# Patient Record
Sex: Male | Born: 1945 | Race: Black or African American | Hispanic: No | Marital: Married | Smoking: Never smoker
Health system: Southern US, Community
[De-identification: ages and names within clinical notes are randomized; demographics above are authoritative.]

## PROBLEM LIST (undated history)

## (undated) ENCOUNTER — Emergency Department (HOSPITAL_COMMUNITY): Payer: Self-pay | Source: Home / Self Care

## (undated) DIAGNOSIS — E785 Hyperlipidemia, unspecified: Secondary | ICD-10-CM

## (undated) DIAGNOSIS — I10 Essential (primary) hypertension: Secondary | ICD-10-CM

## (undated) HISTORY — PX: BACK SURGERY: SHX140

---

## 2001-01-04 ENCOUNTER — Encounter: Admission: RE | Admit: 2001-01-04 | Discharge: 2001-01-04 | Payer: Self-pay | Admitting: Internal Medicine

## 2016-03-16 ENCOUNTER — Telehealth: Payer: Self-pay | Admitting: Orthopedic Surgery

## 2016-03-16 NOTE — Telephone Encounter (Signed)
Patient inquiring about scheduling an appointment within [facility] with one of our providers for problem of left knee pain, Vs. Scheduling  [facility] due to long waiting time, states end of January, when [facility] doctor has availability.  I relayed that [place] has a specific form which would need to be accessed and completed, in order for the [facility] to cover cost of services.  A copy of Records has already been received today, 03/16/16, via fax, which will also require review by Dr Romeo AppleHarrison.  Patient would also need to obtain films, once the [facility] form process has been determined.  Patient's ph#'s [phone] or cell# [phone]

## 2016-03-17 NOTE — Telephone Encounter (Signed)
Shirlee LimerickMarion, can you advise on this request, please?

## 2016-03-19 NOTE — Telephone Encounter (Signed)
Noted.  Awaiting review of records received by Dr Romeo AppleHarrison. Patient aware.

## 2016-03-19 NOTE — Telephone Encounter (Signed)
The patient would need to get written approval for him to be seen at our office.  Dr Romeo AppleHarrison would also need to review the records prior to the patient getting the approval to see if Dr Romeo AppleHarrison would take the case.

## 2016-06-09 NOTE — Telephone Encounter (Signed)
No further records or communication.

## 2018-08-08 ENCOUNTER — Emergency Department (HOSPITAL_COMMUNITY): Payer: No Typology Code available for payment source

## 2018-08-08 ENCOUNTER — Other Ambulatory Visit: Payer: Self-pay

## 2018-08-08 ENCOUNTER — Emergency Department (HOSPITAL_COMMUNITY)
Admission: EM | Admit: 2018-08-08 | Discharge: 2018-08-08 | Disposition: A | Discharge status: Home / Self Care | Payer: No Typology Code available for payment source | Attending: Emergency Medicine | Admitting: Emergency Medicine

## 2018-08-08 ENCOUNTER — Encounter (HOSPITAL_COMMUNITY): Payer: Self-pay | Admitting: Emergency Medicine

## 2018-08-08 DIAGNOSIS — I10 Essential (primary) hypertension: Secondary | ICD-10-CM | POA: Insufficient documentation

## 2018-08-08 DIAGNOSIS — R17 Unspecified jaundice: Secondary | ICD-10-CM | POA: Diagnosis not present

## 2018-08-08 DIAGNOSIS — R1030 Lower abdominal pain, unspecified: Secondary | ICD-10-CM | POA: Insufficient documentation

## 2018-08-08 HISTORY — DX: Hyperlipidemia, unspecified: E78.5

## 2018-08-08 HISTORY — DX: Essential (primary) hypertension: I10

## 2018-08-08 LAB — URINALYSIS, ROUTINE W REFLEX MICROSCOPIC
Bacteria, UA: NONE SEEN
Bilirubin Urine: NEGATIVE
Glucose, UA: NEGATIVE mg/dL
Ketones, ur: NEGATIVE mg/dL
Leukocytes,Ua: NEGATIVE
Nitrite: NEGATIVE
Protein, ur: 100 mg/dL — AB
Specific Gravity, Urine: 1.013 (ref 1.005–1.030)
pH: 5 (ref 5.0–8.0)

## 2018-08-08 LAB — COMPREHENSIVE METABOLIC PANEL
ALT: 37 U/L (ref 0–44)
AST: 39 U/L (ref 15–41)
Albumin: 4 g/dL (ref 3.5–5.0)
Alkaline Phosphatase: 54 U/L (ref 38–126)
Anion gap: 11 (ref 5–15)
BUN: 21 mg/dL (ref 8–23)
CO2: 22 mmol/L (ref 22–32)
Calcium: 8.8 mg/dL — ABNORMAL LOW (ref 8.9–10.3)
Chloride: 107 mmol/L (ref 98–111)
Creatinine, Ser: 1.84 mg/dL — ABNORMAL HIGH (ref 0.61–1.24)
GFR calc Af Amer: 41 mL/min — ABNORMAL LOW (ref >60)
GFR calc non Af Amer: 35 mL/min — ABNORMAL LOW (ref >60)
Glucose, Bld: 107 mg/dL — ABNORMAL HIGH (ref 70–99)
Potassium: 3.8 mmol/L (ref 3.5–5.1)
Sodium: 140 mmol/L (ref 135–145)
Total Bilirubin: 2.8 mg/dL — ABNORMAL HIGH (ref 0.3–1.2)
Total Protein: 6.8 g/dL (ref 6.5–8.1)

## 2018-08-08 LAB — CBC WITH DIFFERENTIAL/PLATELET
Abs Immature Granulocytes: 0.05 10*3/uL (ref 0.00–0.07)
Basophils Absolute: 0.1 10*3/uL (ref 0.0–0.1)
Basophils Relative: 0 %
Eosinophils Absolute: 0 10*3/uL (ref 0.0–0.5)
Eosinophils Relative: 0 %
HCT: 48 % (ref 39.0–52.0)
Hemoglobin: 15.4 g/dL (ref 13.0–17.0)
Immature Granulocytes: 0 %
Lymphocytes Relative: 77 %
Lymphs Abs: 20 10*3/uL — ABNORMAL HIGH (ref 0.7–4.0)
MCH: 26.7 pg (ref 26.0–34.0)
MCHC: 32.1 g/dL (ref 30.0–36.0)
MCV: 83.3 fL (ref 80.0–100.0)
Monocytes Absolute: 0.9 10*3/uL (ref 0.1–1.0)
Monocytes Relative: 3 %
Neutro Abs: 5.3 10*3/uL (ref 1.7–7.7)
Neutrophils Relative %: 20 %
Platelets: 156 10*3/uL (ref 150–400)
RBC: 5.76 MIL/uL (ref 4.22–5.81)
RDW: 15.9 % — ABNORMAL HIGH (ref 11.5–15.5)
WBC: 26.3 10*3/uL — ABNORMAL HIGH (ref 4.0–10.5)
nRBC: 0 % (ref 0.0–0.2)

## 2018-08-08 LAB — LIPASE, BLOOD: Lipase: 36 U/L (ref 11–51)

## 2018-08-08 LAB — POC OCCULT BLOOD, ED: Fecal Occult Bld: NEGATIVE

## 2018-08-08 MED ORDER — IOHEXOL 300 MG/ML  SOLN
30.0000 mL | Freq: Once | INTRAMUSCULAR | Status: AC | PRN
  Administered 2018-08-08: 30 mL via ORAL

## 2018-08-08 MED ORDER — ONDANSETRON HCL 4 MG PO TABS: 4.0000 mg | ORAL_TABLET | Freq: Three times a day (TID) | ORAL | 0 refills | Status: AC | PRN

## 2018-08-08 MED ORDER — ONDANSETRON HCL 4 MG/2ML IJ SOLN
4.0000 mg | Freq: Once | INTRAMUSCULAR | Status: AC
  Administered 2018-08-08: 4 mg via INTRAVENOUS
  Filled 2018-08-08: qty 2

## 2018-08-08 MED ORDER — MORPHINE SULFATE (PF) 4 MG/ML IV SOLN
4.0000 mg | Freq: Once | INTRAVENOUS | Status: AC
  Administered 2018-08-08: 4 mg via INTRAVENOUS
  Filled 2018-08-08: qty 1

## 2018-08-08 MED ORDER — TRAMADOL HCL 50 MG PO TABS: 50.0000 mg | ORAL_TABLET | Freq: Four times a day (QID) | ORAL | 0 refills | Status: AC | PRN

## 2018-08-08 NOTE — Discharge Instructions (Addendum)
As discussed, call your primary provider at the [place] to arrange a follow-up appt.  Your bilirubin level is elevated and this will need to be rechecked in 1-2 weeks.

## 2018-08-08 NOTE — ED Provider Notes (Signed)
  Pt signed out to me by Burgess Amor, PA-C and end of shift, CT abd/pelvis is pending.   73 yo male with hx of lower abdominal pain for 2 weeks, nausea and one episode of vomiting.  He has a hx of leukocytosis that is followed by oncology at the [place] in [place].  No fevers, chills, dyspnea or chest pain.  total bili is elevated today. No previous medical records are available for comparison.   Admits to hx of alcohol use, drinks 3-4 beers per day.    CT shows some mild ascites, no indication for paracentesis.     Ct Abdomen Pelvis Wo Contrast  Result Date: 08/08/2018 CLINICAL DATA:  Mid abdominal pain for 2 weeks. Vomiting yesterday. Abnormally colored stools. EXAM: CT ABDOMEN AND PELVIS WITHOUT CONTRAST TECHNIQUE: Multidetector CT imaging of the abdomen and pelvis was performed following the standard protocol without IV contrast. COMPARISON:  Plain films chest and abdomen today. FINDINGS: Lower chest: There is cardiomegaly. The patient has small bilateral pleural effusions. Mild dependent atelectasis is noted. No pericardial effusion. Hepatobiliary: The liver is low attenuating consistent with fatty infiltration. No focal lesion is identified. The gallbladder and biliary tree appear normal. Pancreas: Unremarkable. No pancreatic ductal dilatation or surrounding inflammatory changes. Spleen: Normal in size without focal abnormality. Adrenals/Urinary Tract: The adrenal glands appear normal. There is scarring in the midpole of the right kidney. Left renal cyst measures 11 cm in diameter. No hydronephrosis on the right or left. Ureters and urinary bladder are unremarkable. Stomach/Bowel: Stomach is within normal limits. Appendix appears normal. No evidence of bowel wall thickening, distention, or inflammatory changes. Vascular/Lymphatic: Aortic atherosclerosis. No enlarged abdominal or pelvic lymph nodes. Reproductive: Prostatomegaly is identified. Other: The patient has a small volume of abdominal and pelvic  ascites. Musculoskeletal: No focal lesion.  Lower lumbar spondylosis noted. IMPRESSION: Small volume of abdominal and pelvic ascites. Cause for this finding is not identified. Small bilateral pleural effusions. Cardiomegaly. Prostatomegaly. Fatty infiltration of the liver. Atherosclerosis. Electronically Signed   By: Drusilla Kanner M.D.   On: 08/08/2018 18:41      On recheck, he is resting comfortably.  Denies any pain at present.  Discussed lab and CT results and I have encouraged alcohol cessation.  Appears appropriate for d/c home and agrees to close f/u with his PCP at the [place].  I will provide rx for anti-emetic and short course of pain medication.  Return precautions discussed.    Pauline Aus, PA-C 08/08/18 1951    Linwood Dibbles, MD 08/11/18 [phone]

## 2018-08-08 NOTE — ED Provider Notes (Signed)
[facility] EMERGENCY DEPARTMENT Provider Note   CSN: [id number] Arrival date & time: 08/08/18  1223    History   Chief Complaint Chief Complaint  Patient presents with  . Abdominal Pain    HPI MICHAELANDREW ANEZ is a 73 y.o. male with a history of htn and leukocytosis ([facility] presenting with a 2 week history of worsening low abdominal pain along with nausea and emesis x 1 yesterday (described as pink colored liquid).  He denies fevers or chills, no diarrhea but reports a darker than normal stool intermittently, had a loose bm about 2 hours before arrival, nonbloody, which did not relieve sx.  He denies dysuria, has no back pain or abdominal distention. He has taken pepto bismol without improvement in sx. He reports a distant history of PUD, denies any recent problems with GERD/ reflux.   He reports having a fair appetite until he woke today.     The history is provided by the patient.    Past Medical History:  Diagnosis Date  . Hyperlipidemia   . Hypertension     There are no active problems to display for this patient.   Past Surgical History:  Procedure Laterality Date  . BACK SURGERY          Home Medications    Prior to Admission medications   Not on File    Family History History reviewed. No pertinent family history.  Social History Social History   Tobacco Use  . Smoking status: Never Smoker  . Smokeless tobacco: Never Used  Substance Use Topics  . Alcohol use: Yes    Frequency: Never    Comment: 2-3 beers a day  . Drug use: Never     Allergies   Patient has no known allergies.   Review of Systems Review of Systems  Constitutional: Negative for chills and fever.  HENT: Negative for congestion and sore throat.   Eyes: Negative.   Respiratory: Negative for chest tightness and shortness of breath.   Cardiovascular: Negative for chest pain.  Gastrointestinal: Positive for abdominal pain, diarrhea, nausea and  vomiting. Negative for blood in stool.  Genitourinary: Negative.   Musculoskeletal: Negative for arthralgias, joint swelling and neck pain.  Skin: Negative.  Negative for rash and wound.  Neurological: Negative for dizziness, weakness, light-headedness, numbness and headaches.  Psychiatric/Behavioral: Negative.      Physical Exam Updated Vital Signs BP (!) 117/99 (BP Location: Left Arm)   Pulse (!) 57   Temp (!) 97.1 F (36.2 C) (Oral)   Resp 17   Ht 6\' 5"  (1.956 m)   Wt 102.1 kg   SpO2 100%   BMI 26.68 kg/m   Physical Exam Vitals signs and nursing note reviewed.  Constitutional:      Appearance: He is well-developed.  HENT:     Head: Normocephalic and atraumatic.  Eyes:     Conjunctiva/sclera: Conjunctivae normal.  Neck:     Musculoskeletal: Normal range of motion.  Cardiovascular:     Rate and Rhythm: Normal rate and regular rhythm.     Heart sounds: Normal heart sounds.  Pulmonary:     Effort: Pulmonary effort is normal.     Breath sounds: Normal breath sounds. No wheezing.  Abdominal:     General: Bowel sounds are normal. There is distension.     Palpations: Abdomen is soft.     Tenderness: There is abdominal tenderness in the right lower quadrant and left lower quadrant. There  is no guarding or rebound.  Musculoskeletal: Normal range of motion.  Skin:    General: Skin is warm and dry.  Neurological:     Mental Status: He is alert.      ED Treatments / Results  Labs (all labs ordered are listed, but only abnormal results are displayed) Labs Reviewed  CBC WITH DIFFERENTIAL/PLATELET - Abnormal; Notable for the following components:      Result Value   WBC 26.3 (*)    RDW 15.9 (*)    Lymphs Abs 20.0 (*)    All other components within normal limits  COMPREHENSIVE METABOLIC PANEL - Abnormal; Notable for the following components:   Glucose, Bld 107 (*)    Creatinine, Ser 1.84 (*)    Calcium 8.8 (*)    Total Bilirubin 2.8 (*)    GFR calc non Af Amer 35  (*)    GFR calc Af Amer 41 (*)    All other components within normal limits  URINALYSIS, ROUTINE W REFLEX MICROSCOPIC - Abnormal; Notable for the following components:   Hgb urine dipstick SMALL (*)    Protein, ur 100 (*)    All other components within normal limits  LIPASE, BLOOD  PATHOLOGIST SMEAR REVIEW  POC OCCULT BLOOD, ED    EKG None  Radiology No results found.  Procedures Procedures (including critical care time)  Medications Ordered in ED Medications  morphine 4 MG/ML injection 4 mg (has no administration in time range)  ondansetron (ZOFRAN) injection 4 mg (has no administration in time range)     Initial Impression / Assessment and Plan / ED Course  I have reviewed the triage vital signs and the nursing notes.  Pertinent labs & imaging results that were available during my care of the patient were reviewed by me and considered in my medical decision making (see chart for details).        Pt with abdominal pain of unclear etiology.  Several abnormal labs including wbc elevation of 26.3. Pt reports [facility] and states his last lab visit with them was last Thursday.  He was told his wbc was 23 then and the MD commented that it was improving.    Bilirubin is elevated, pt denies any hx of liver problems. He does endorse drinking 3 beers daily.  No h/o hepatitis.  Hemoccult negative.  Pending CT at this time, noncontrast , oral only. Discussed with Pauline Ausammy Triplett PA who assumes care of pt.  Final Clinical Impressions(s) / ED Diagnoses   Final diagnoses:  None    ED Discharge Orders    None       Victoriano Laindol, Kallen Delatorre, PA-C 08/08/18 1712    Linwood DibblesKnapp, Jon, MD 08/11/18 [phone]

## 2018-08-08 NOTE — ED Triage Notes (Signed)
PT c/o middle abdominal pain x2 weeks. PT complaints of vomiting x1 yesterday and occasional nausea. PT denies any urinary symptoms and had BM yesterday. PT states some dark stools and states hx of peptic ulcers.

## 2018-08-10 LAB — PATHOLOGIST SMEAR REVIEW

## 2018-09-04 ENCOUNTER — Other Ambulatory Visit: Payer: MEDICARE

## 2018-09-04 ENCOUNTER — Other Ambulatory Visit: Payer: Self-pay

## 2018-09-04 DIAGNOSIS — Z20822 Contact with and (suspected) exposure to covid-19: Secondary | ICD-10-CM

## 2018-09-05 LAB — NOVEL CORONAVIRUS, NAA: SARS-CoV-2, NAA: NOT DETECTED

## 2018-11-09 ENCOUNTER — Other Ambulatory Visit: Payer: Self-pay | Admitting: Internal Medicine

## 2018-11-09 DIAGNOSIS — Z20822 Contact with and (suspected) exposure to covid-19: Secondary | ICD-10-CM

## 2018-11-11 LAB — NOVEL CORONAVIRUS, NAA: SARS-CoV-2, NAA: NOT DETECTED

## 2020-05-17 IMAGING — CT CT ABDOMEN AND PELVIS WITHOUT CONTRAST
2 of 4 series · 15 of 46 positions shown, 17 images · non-contrast
Comparison: Plain films chest and abdomen today.

CLINICAL DATA: Mid abdominal pain for 2 weeks. Vomiting yesterday.
Abnormally colored stools.

EXAM:
CT ABDOMEN AND PELVIS WITHOUT CONTRAST
TECHNIQUE: Multidetector CT imaging of the abdomen and pelvis was performed
following the standard protocol without IV contrast.

[Series 2: axial st · axial · 0.73mm/px · z∈[-694,-219]mm · 12 of 107 slices shown, 14 images]
[im 6/107  soft-tissue]
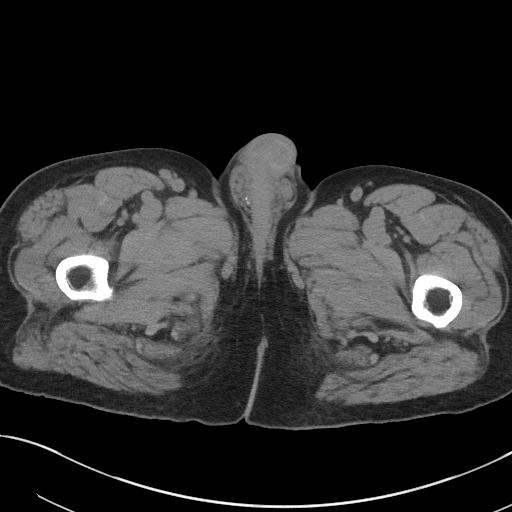
[im 6/107  bone]
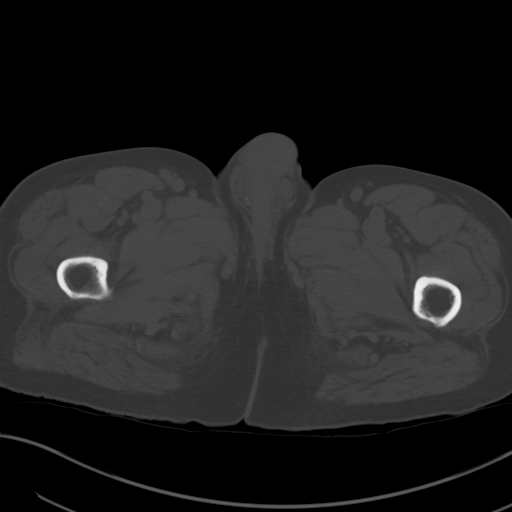
[im 16/107  soft-tissue]
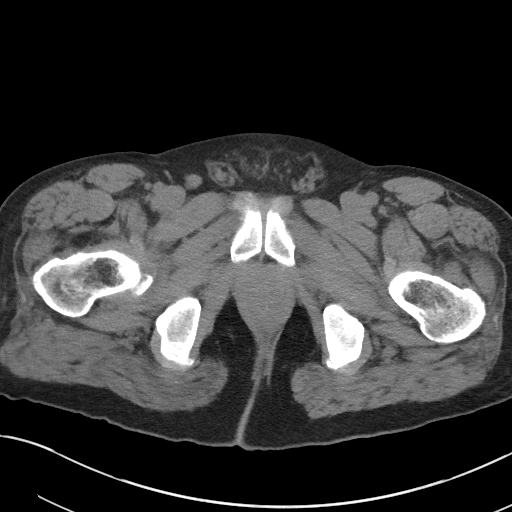
[im 22/107  soft-tissue]
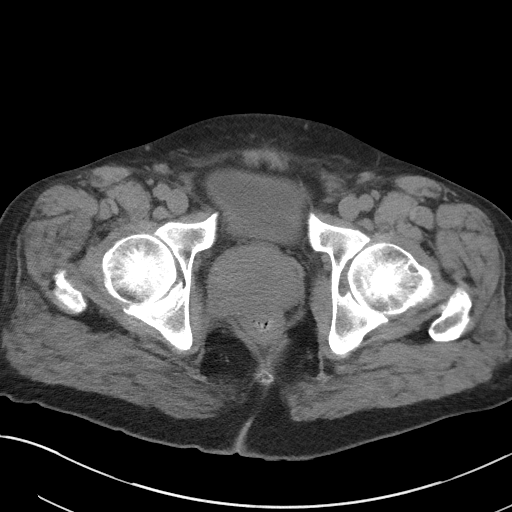
[im 32/107  soft-tissue]
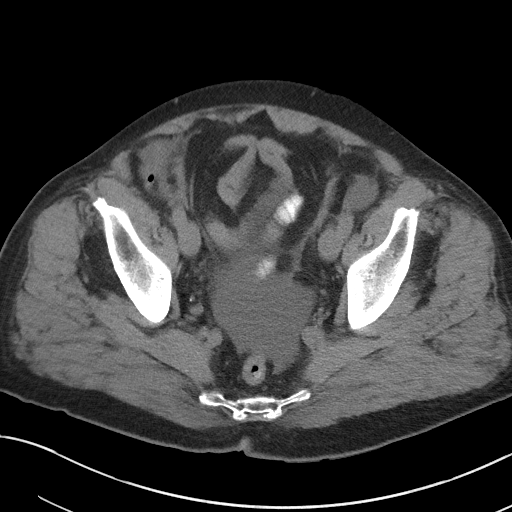
[im 43/107  soft-tissue]
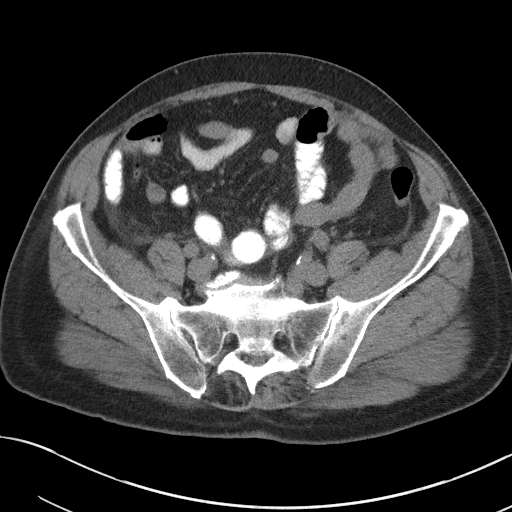
[im 48/107  soft-tissue]
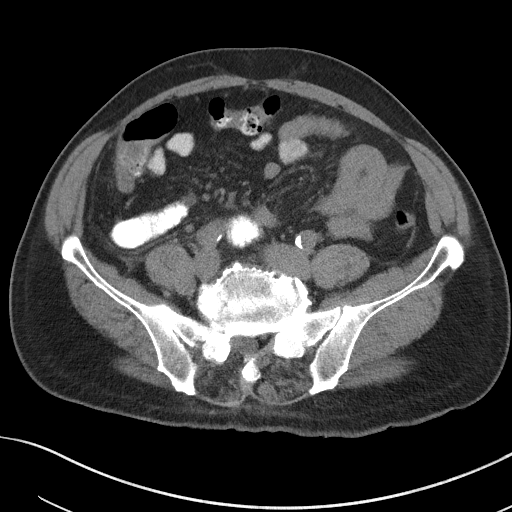
[im 59/107  soft-tissue]
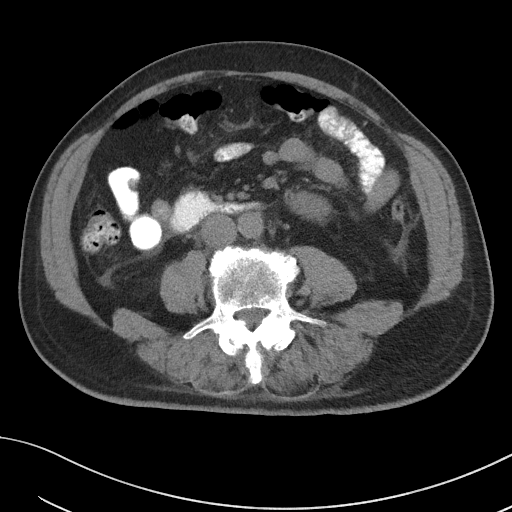
[im 64/107  soft-tissue]
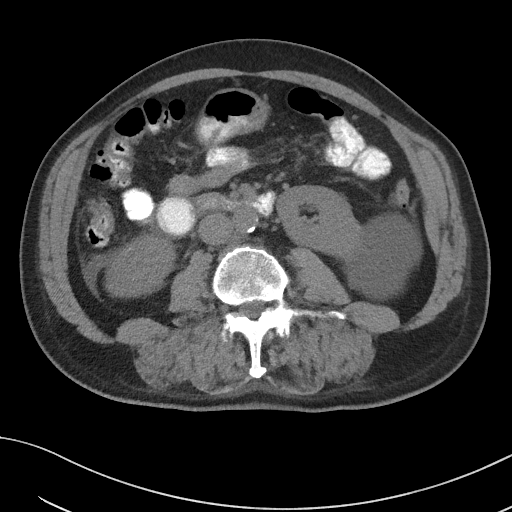
[im 75/107  soft-tissue]
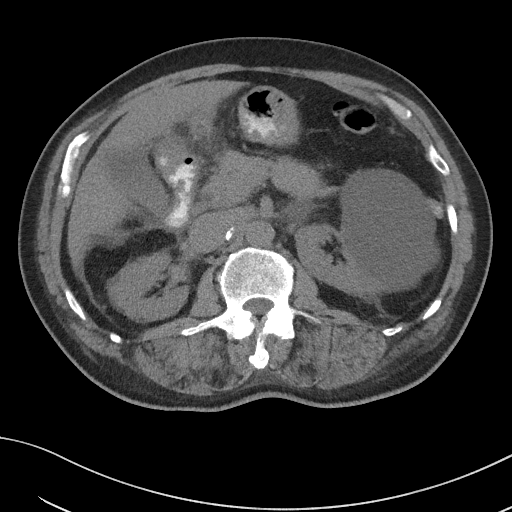
[im 75/107  bone]
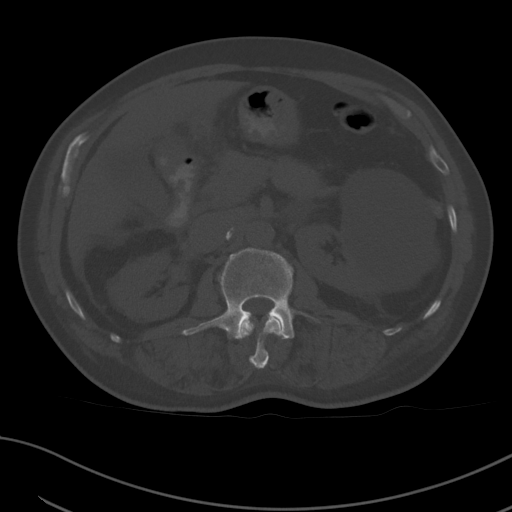
[im 85/107  soft-tissue]
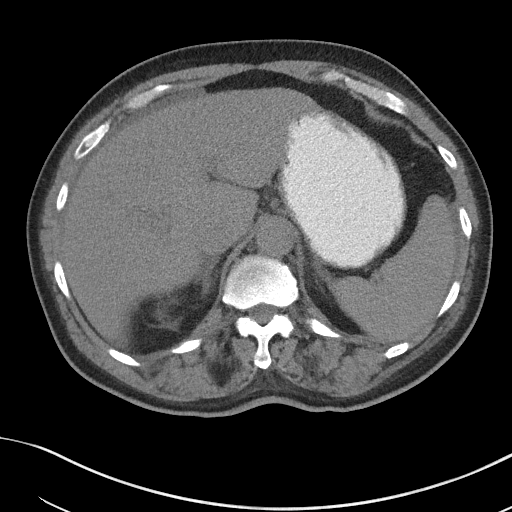
[im 91/107  soft-tissue]
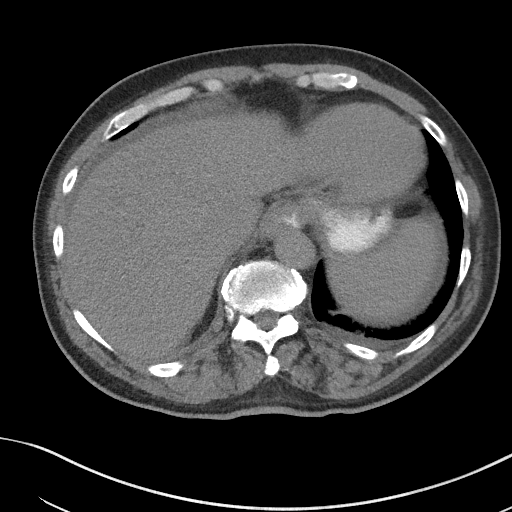
[im 101/107  soft-tissue]
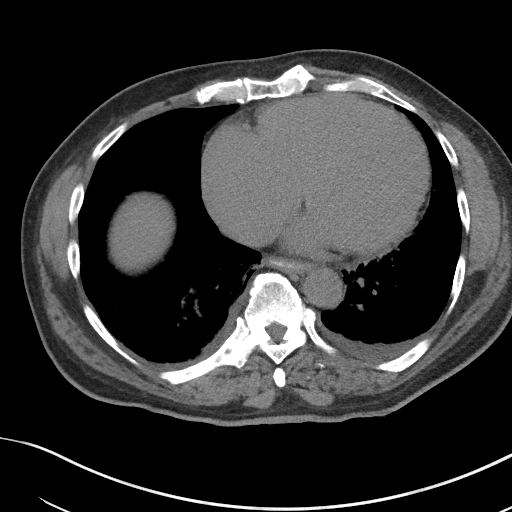

[Series 5: coronal st · coronal · 0.69mm/px · 3 of 95 slices shown]
[im 32/95  soft-tissue]
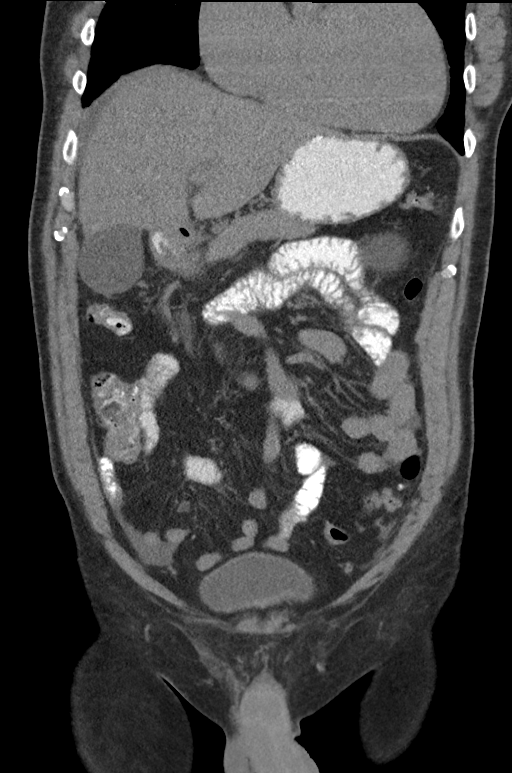
[im 42/95  soft-tissue]
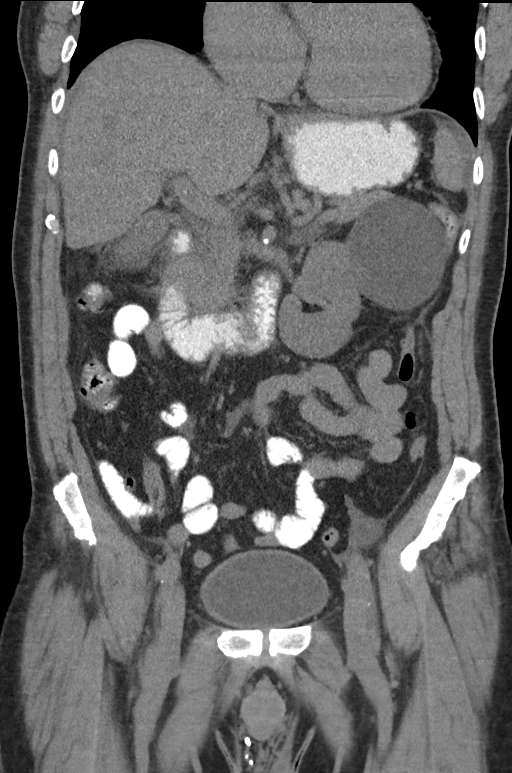
[im 53/95  soft-tissue]
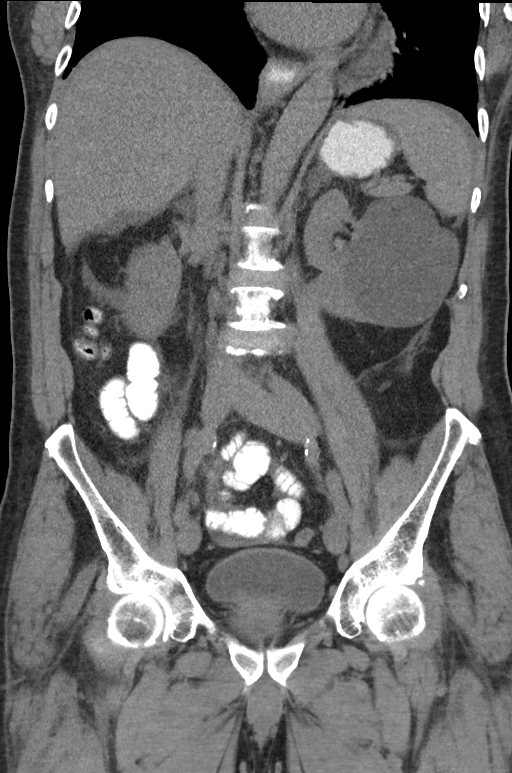

[15 of 46 positions shown; findings below may reference images not displayed]

FINDINGS: Lower chest: There is cardiomegaly. The patient has small bilateral
pleural effusions. Mild dependent atelectasis is noted. No
pericardial effusion.

Hepatobiliary: The liver is low attenuating consistent with fatty
infiltration. No focal lesion is identified. The gallbladder and
biliary tree appear normal.

Pancreas: Unremarkable. No pancreatic ductal dilatation or
surrounding inflammatory changes.

Spleen: Normal in size without focal abnormality.

Adrenals/Urinary Tract: The adrenal glands appear normal. There is
scarring in the midpole of the right kidney. Left renal cyst
measures 11 cm in diameter. No hydronephrosis on the right or left.
Ureters and urinary bladder are unremarkable.

Stomach/Bowel: Stomach is within normal limits. Appendix appears
normal. No evidence of bowel wall thickening, distention, or
inflammatory changes.

Vascular/Lymphatic: Aortic atherosclerosis. No enlarged abdominal or
pelvic lymph nodes.

Reproductive: Prostatomegaly is identified.

Other: The patient has a small volume of abdominal and pelvic
ascites.

Musculoskeletal: No focal lesion.  Lower lumbar spondylosis noted.
IMPRESSION: Small volume of abdominal and pelvic ascites. Cause for this finding
is not identified.

Small bilateral pleural effusions.

Cardiomegaly.

Prostatomegaly.

Fatty infiltration of the liver.

Atherosclerosis.

## 2020-05-17 IMAGING — DX DG ABDOMEN ACUTE W/ 1V CHEST
3 series · 3 of 3 positions shown · non-contrast
Comparison: None.

CLINICAL DATA: Abdominal pain x2 weeks.

EXAM:
DG ABDOMEN ACUTE W/ 1V CHEST

[chest pa]
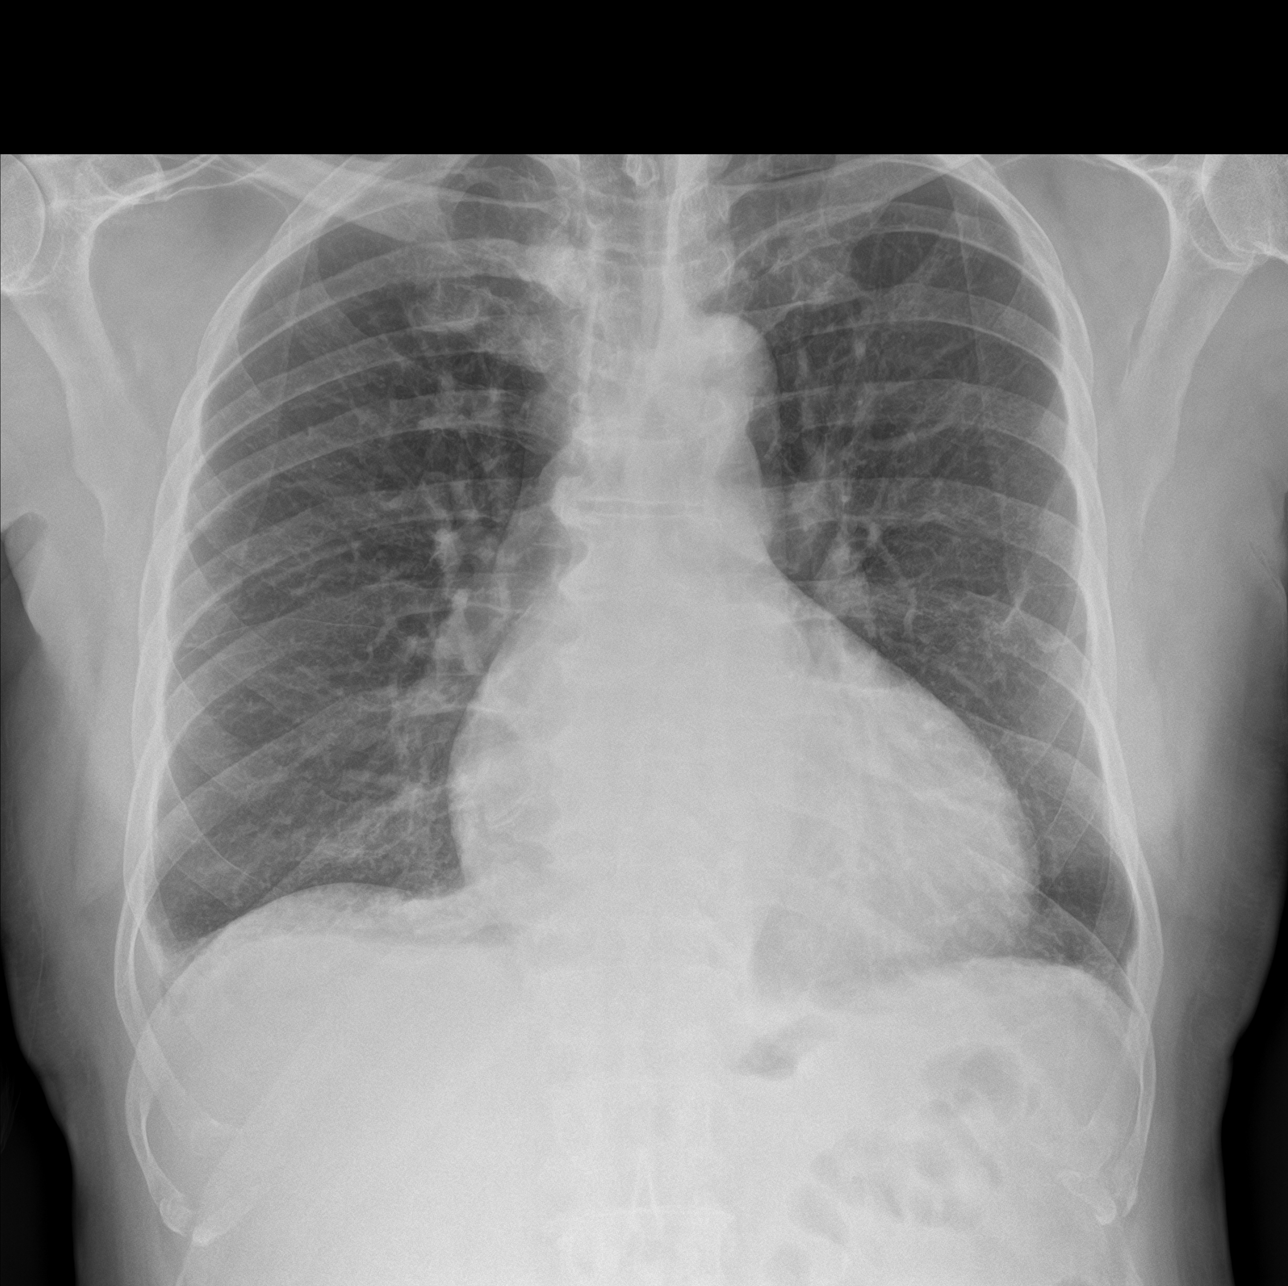

[abdomen erect]
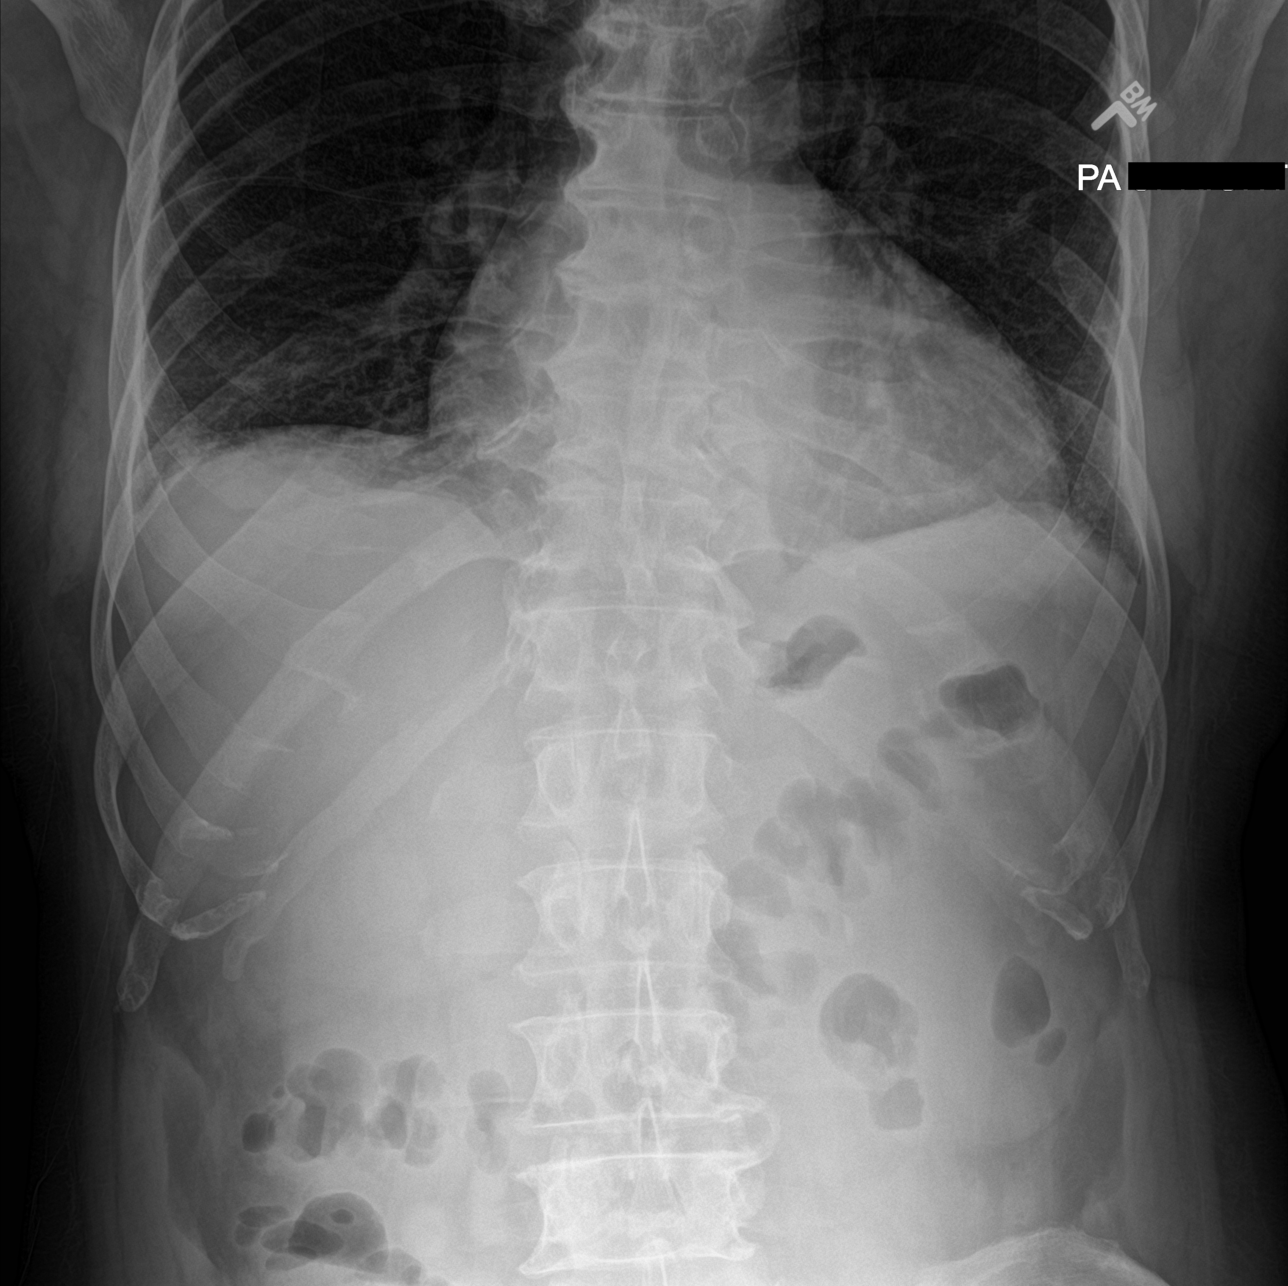

[abdomen supine]
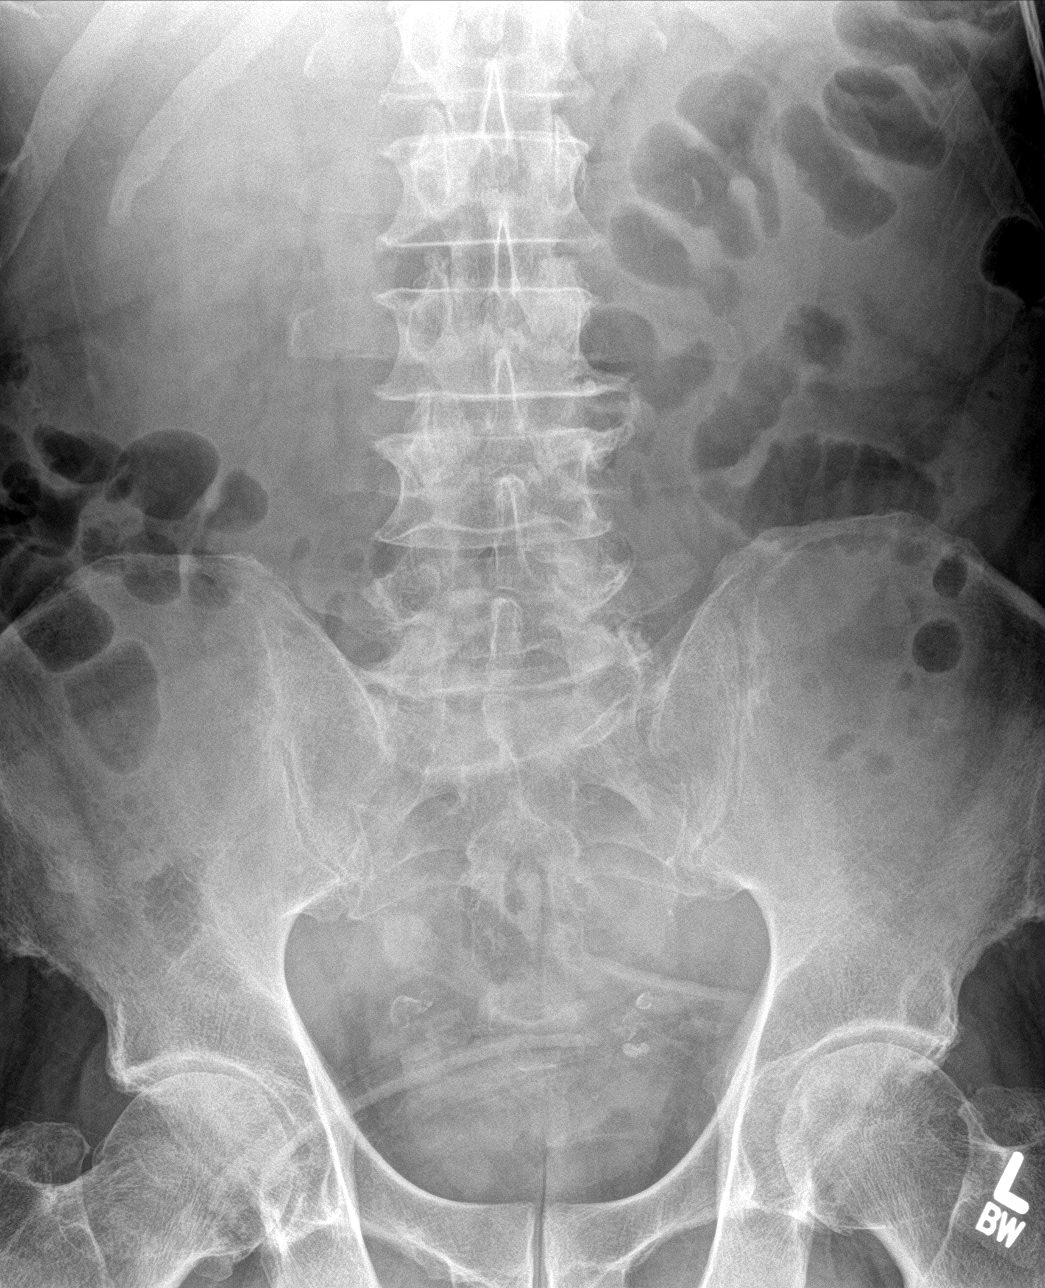

[3 of 3 positions shown; findings below may reference images not displayed]

FINDINGS: The cardiac silhouette is enlarged. There is no pneumothorax. Mild
blunting is noted at the costophrenic angles bilaterally. No large
area of consolidation. There is a mildly dilated loop of small bowel
in the left lower quadrant measuring 3.5 cm in diameter. There are
no air-fluid levels. There is no concerning radiopaque stone.
Phleboliths project over the patient's pelvis. There are
degenerative changes of both hips with subchondral cystic changes
involving the left acetabulum.
IMPRESSION: 1. No acute cardiopulmonary process identified.
2. There is a single mildly dilated loop of small bowel in the left
lower quadrant measuring 3.5 cm. No air-fluid levels are present.
Findings could represent a focal enteritis in the appropriate
clinical setting. If symptoms persist, short term interval follow-up
is recommended.
3. Cardiomegaly.
